# Patient Record
Sex: Male | Born: 1980 | Race: White | Hispanic: No | Marital: Married | Smoking: Current every day smoker
Health system: Southern US, Community
[De-identification: ages and names within clinical notes are randomized; demographics above are authoritative.]

## PROBLEM LIST (undated history)

## (undated) DIAGNOSIS — R569 Unspecified convulsions: Secondary | ICD-10-CM

## (undated) DIAGNOSIS — F419 Anxiety disorder, unspecified: Secondary | ICD-10-CM

## (undated) HISTORY — PX: FRACTURE SURGERY: SHX138

---

## 2003-07-13 ENCOUNTER — Other Ambulatory Visit: Payer: Self-pay

## 2004-06-15 ENCOUNTER — Emergency Department: Payer: Self-pay | Admitting: Emergency Medicine

## 2004-06-18 ENCOUNTER — Emergency Department: Payer: Self-pay | Admitting: Unknown Physician Specialty

## 2004-06-27 ENCOUNTER — Emergency Department: Payer: Self-pay | Admitting: Emergency Medicine

## 2004-07-30 ENCOUNTER — Emergency Department: Payer: Self-pay | Admitting: Emergency Medicine

## 2004-08-20 ENCOUNTER — Emergency Department: Payer: Self-pay | Admitting: Emergency Medicine

## 2004-12-07 ENCOUNTER — Emergency Department: Payer: Self-pay | Admitting: Emergency Medicine

## 2005-04-02 ENCOUNTER — Emergency Department: Payer: Self-pay | Admitting: Emergency Medicine

## 2005-05-31 ENCOUNTER — Emergency Department: Payer: Self-pay | Admitting: Internal Medicine

## 2005-07-01 ENCOUNTER — Emergency Department: Payer: Self-pay | Admitting: Emergency Medicine

## 2005-07-03 ENCOUNTER — Emergency Department: Payer: Self-pay | Admitting: Emergency Medicine

## 2005-07-04 ENCOUNTER — Emergency Department: Payer: Self-pay | Admitting: Unknown Physician Specialty

## 2005-07-05 ENCOUNTER — Other Ambulatory Visit: Payer: Self-pay

## 2005-07-05 ENCOUNTER — Emergency Department: Payer: Self-pay | Admitting: Emergency Medicine

## 2005-07-06 ENCOUNTER — Inpatient Hospital Stay: Payer: Self-pay | Admitting: Unknown Physician Specialty

## 2005-07-07 ENCOUNTER — Other Ambulatory Visit: Payer: Self-pay

## 2005-10-23 ENCOUNTER — Emergency Department: Payer: Self-pay | Admitting: Emergency Medicine

## 2005-10-29 ENCOUNTER — Ambulatory Visit: Payer: Self-pay | Admitting: Hospitalist

## 2005-10-29 ENCOUNTER — Inpatient Hospital Stay (HOSPITAL_COMMUNITY): Admission: EM | Admit: 2005-10-29 | Discharge: 2005-11-01 | Payer: Self-pay | Admitting: Emergency Medicine

## 2005-12-20 ENCOUNTER — Emergency Department: Payer: Self-pay | Admitting: Emergency Medicine

## 2006-01-02 ENCOUNTER — Emergency Department: Payer: Self-pay | Admitting: Emergency Medicine

## 2008-04-17 ENCOUNTER — Emergency Department: Payer: Self-pay | Admitting: Internal Medicine

## 2008-12-05 ENCOUNTER — Emergency Department: Payer: Self-pay | Admitting: Emergency Medicine

## 2009-02-02 ENCOUNTER — Ambulatory Visit: Payer: Self-pay | Admitting: Internal Medicine

## 2009-02-03 ENCOUNTER — Ambulatory Visit: Payer: Self-pay | Admitting: Internal Medicine

## 2009-02-15 ENCOUNTER — Other Ambulatory Visit: Payer: Self-pay | Admitting: Specialist

## 2009-02-25 ENCOUNTER — Ambulatory Visit: Payer: Self-pay | Admitting: Surgery

## 2010-02-27 ENCOUNTER — Emergency Department: Payer: Self-pay | Admitting: Emergency Medicine

## 2010-07-23 ENCOUNTER — Inpatient Hospital Stay: Payer: Self-pay | Admitting: Psychiatry

## 2010-08-23 ENCOUNTER — Ambulatory Visit: Payer: Self-pay | Admitting: Family Medicine

## 2010-08-28 ENCOUNTER — Ambulatory Visit: Payer: Self-pay | Admitting: Family Medicine

## 2011-01-17 ENCOUNTER — Ambulatory Visit: Payer: Self-pay | Admitting: Family Medicine

## 2013-01-31 IMAGING — CR DG ABDOMEN 1V
1 series · 2 of 2 positions shown · non-contrast
Comparison: none

REASON FOR EXAM: right flank pain
COMMENTS:

PROCEDURE:     MDR - MDR KIDNEY URETER BLADDER  - January 17, 2011 [DATE]
RESULT:     Comparison is made to a prior exam of 08/23/2010. The bowel gas
pattern is normal. No definite renal or ureteral calcifications are seen.
The osseous structures are normal in appearance.

[Series 1: view not recorded · 0.17mm/px · 2 of 2 slices shown]
[im 1/2]
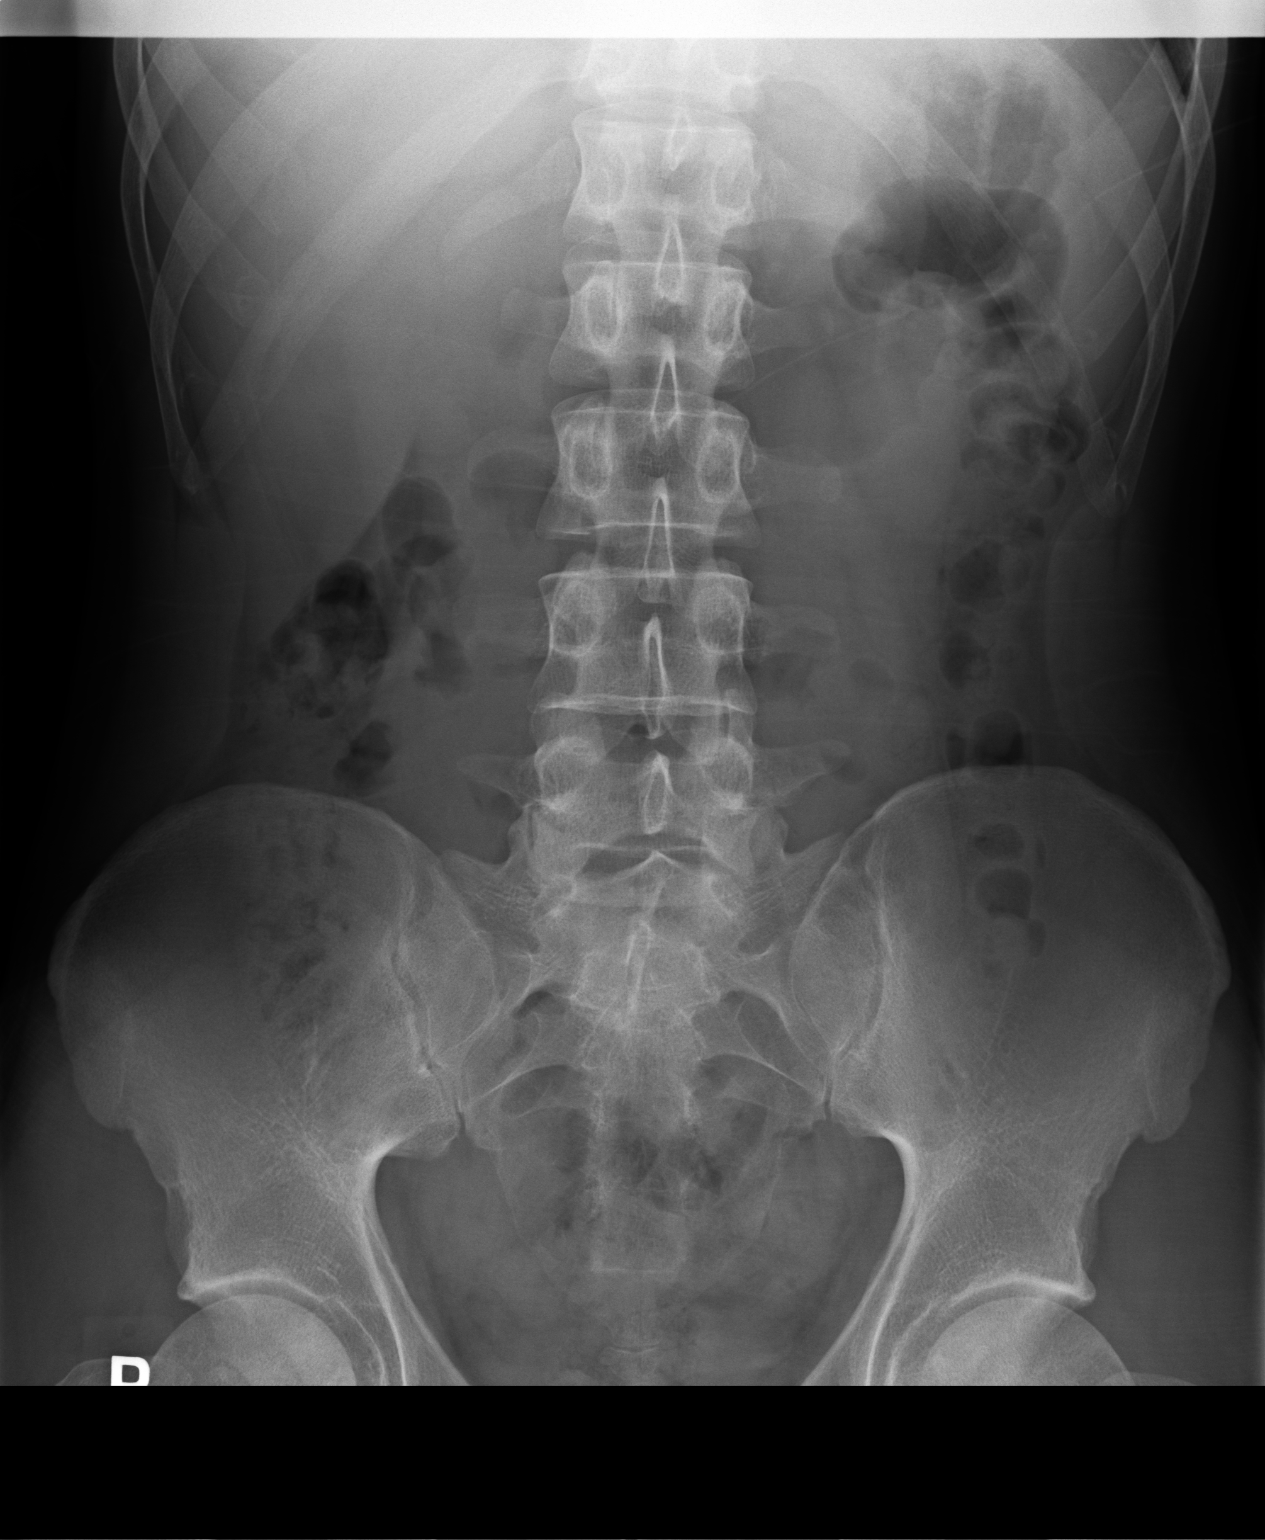
[im 2/2]
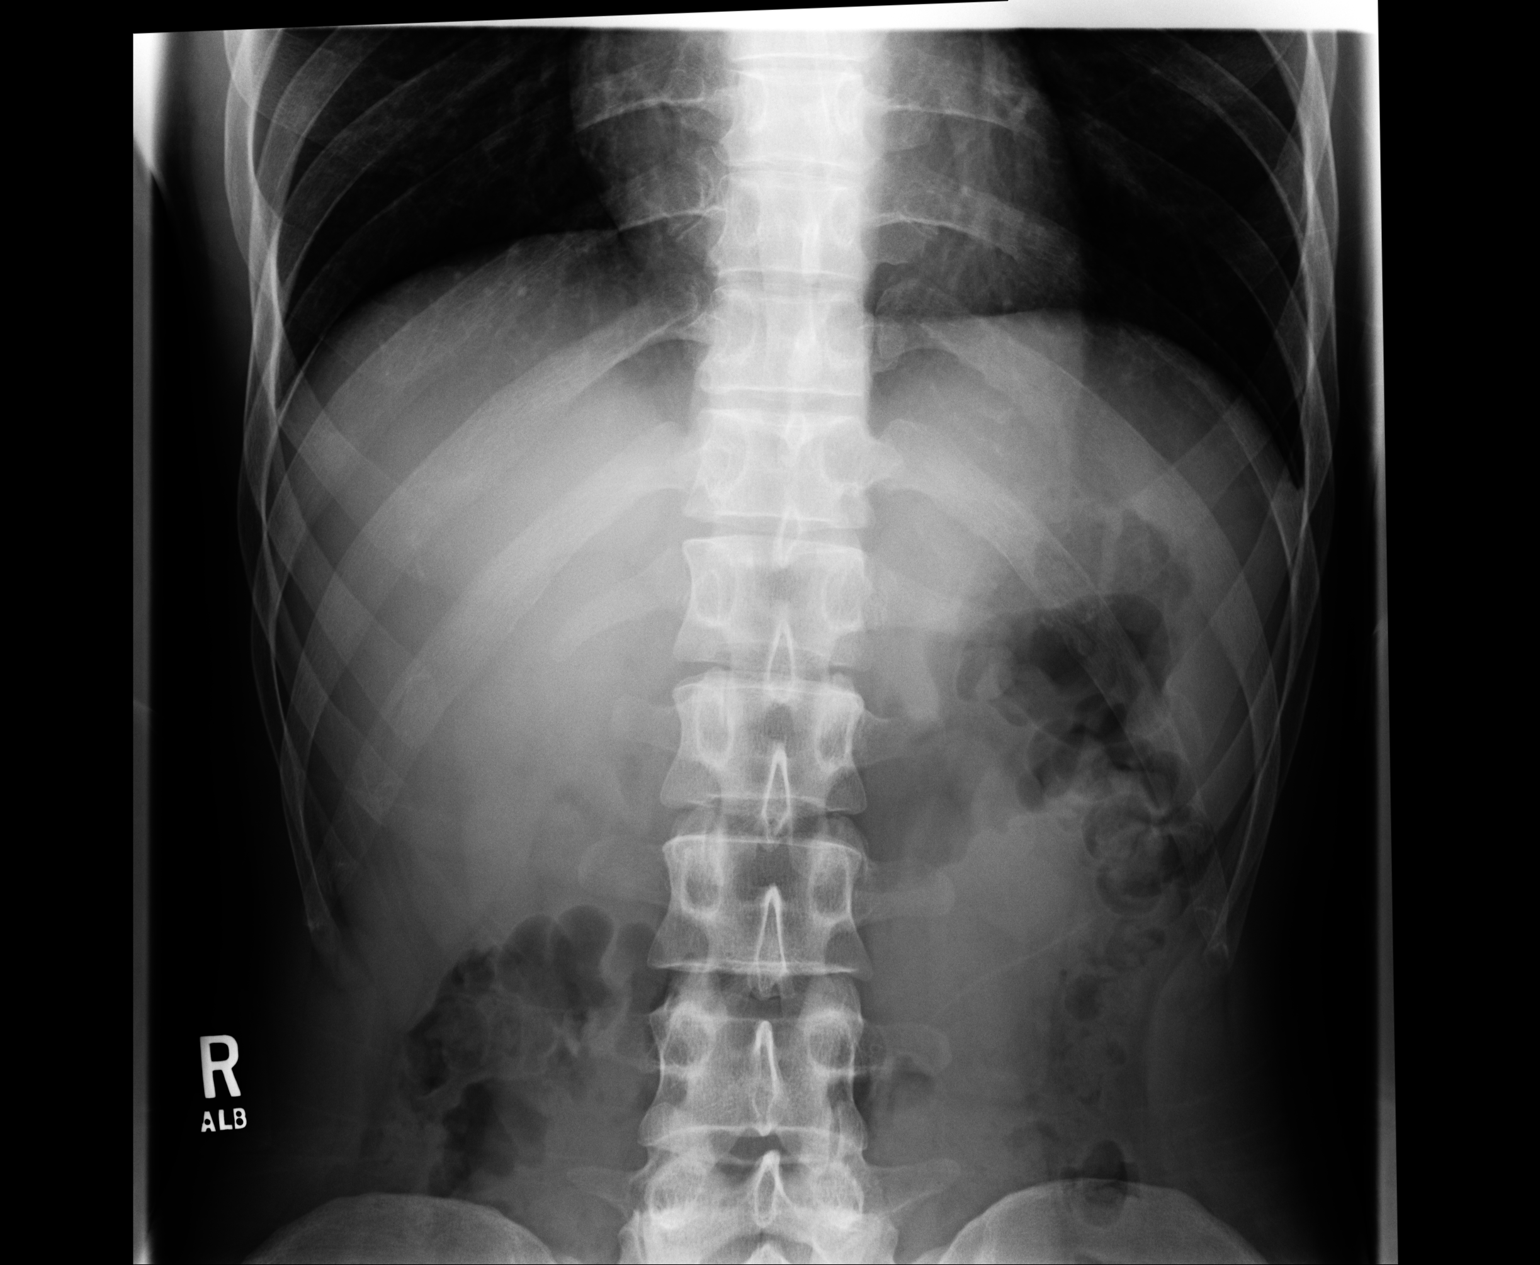

[2 of 2 positions shown; findings below may reference images not displayed]

IMPRESSION: 1.     No significant abnormalities are noted.

## 2015-04-23 ENCOUNTER — Emergency Department
Admission: EM | Admit: 2015-04-23 | Discharge: 2015-04-23 | Disposition: A | Discharge status: Home / Self Care | Payer: Medicaid Other | Attending: Emergency Medicine | Admitting: Emergency Medicine

## 2015-04-23 ENCOUNTER — Encounter: Payer: Self-pay | Admitting: Emergency Medicine

## 2015-04-23 DIAGNOSIS — R45851 Suicidal ideations: Secondary | ICD-10-CM

## 2015-04-23 DIAGNOSIS — F918 Other conduct disorders: Secondary | ICD-10-CM | POA: Insufficient documentation

## 2015-04-23 DIAGNOSIS — F17200 Nicotine dependence, unspecified, uncomplicated: Secondary | ICD-10-CM | POA: Insufficient documentation

## 2015-04-23 DIAGNOSIS — R4689 Other symptoms and signs involving appearance and behavior: Secondary | ICD-10-CM

## 2015-04-23 HISTORY — DX: Anxiety disorder, unspecified: F41.9

## 2015-04-23 LAB — CBC WITH DIFFERENTIAL/PLATELET
BASOS ABS: 0.1 10*3/uL (ref 0–0.1)
Basophils Relative: 1 %
EOS ABS: 0.2 10*3/uL (ref 0–0.7)
Eosinophils Relative: 2 %
HCT: 48 % (ref 40.0–52.0)
HEMOGLOBIN: 16.1 g/dL (ref 13.0–18.0)
LYMPHS ABS: 3.6 10*3/uL (ref 1.0–3.6)
LYMPHS PCT: 36 %
MCH: 29.3 pg (ref 26.0–34.0)
MCHC: 33.5 g/dL (ref 32.0–36.0)
MCV: 87.5 fL (ref 80.0–100.0)
Monocytes Absolute: 0.8 10*3/uL (ref 0.2–1.0)
Monocytes Relative: 8 %
NEUTROS PCT: 53 %
Neutro Abs: 5.3 10*3/uL (ref 1.4–6.5)
PLATELETS: 337 10*3/uL (ref 150–440)
RBC: 5.49 MIL/uL (ref 4.40–5.90)
RDW: 12.8 % (ref 11.5–14.5)
WBC: 10.1 10*3/uL (ref 3.8–10.6)

## 2015-04-23 LAB — URINALYSIS COMPLETE WITH MICROSCOPIC (ARMC ONLY)
BACTERIA UA: NONE SEEN
Glucose, UA: NEGATIVE mg/dL
Hgb urine dipstick: NEGATIVE
Leukocytes, UA: NEGATIVE
Nitrite: NEGATIVE
PH: 5 (ref 5.0–8.0)
PROTEIN: 100 mg/dL — AB
SQUAMOUS EPITHELIAL / LPF: NONE SEEN
Specific Gravity, Urine: 1.033 — ABNORMAL HIGH (ref 1.005–1.030)

## 2015-04-23 LAB — COMPREHENSIVE METABOLIC PANEL
ALBUMIN: 5 g/dL (ref 3.5–5.0)
ALK PHOS: 70 U/L (ref 38–126)
ALT: 15 U/L — AB (ref 17–63)
AST: 21 U/L (ref 15–41)
Anion gap: 7 (ref 5–15)
BUN: 15 mg/dL (ref 6–20)
CALCIUM: 9.8 mg/dL (ref 8.9–10.3)
CHLORIDE: 102 mmol/L (ref 101–111)
CO2: 32 mmol/L (ref 22–32)
CREATININE: 1.39 mg/dL — AB (ref 0.61–1.24)
GFR calc non Af Amer: >60 mL/min (ref >60)
GLUCOSE: 97 mg/dL (ref 65–99)
Potassium: 3.3 mmol/L — ABNORMAL LOW (ref 3.5–5.1)
SODIUM: 141 mmol/L (ref 135–145)
Total Bilirubin: 1.4 mg/dL — ABNORMAL HIGH (ref 0.3–1.2)
Total Protein: 8.1 g/dL (ref 6.5–8.1)

## 2015-04-23 LAB — URINE DRUG SCREEN, QUALITATIVE (ARMC ONLY)
AMPHETAMINES, UR SCREEN: NOT DETECTED
BENZODIAZEPINE, UR SCRN: POSITIVE — AB
Barbiturates, Ur Screen: NOT DETECTED
Cannabinoid 50 Ng, Ur ~~LOC~~: NOT DETECTED
Cocaine Metabolite,Ur ~~LOC~~: POSITIVE — AB
MDMA (ECSTASY) UR SCREEN: NOT DETECTED
Methadone Scn, Ur: NOT DETECTED
Opiate, Ur Screen: NOT DETECTED
PHENCYCLIDINE (PCP) UR S: NOT DETECTED
TRICYCLIC, UR SCREEN: NOT DETECTED

## 2015-04-23 LAB — SALICYLATE LEVEL: Salicylate Lvl: <4 mg/dL (ref 2.8–30.0)

## 2015-04-23 LAB — ACETAMINOPHEN LEVEL

## 2015-04-23 LAB — ETHANOL

## 2015-04-23 MED ORDER — BUPRENORPHINE HCL 8 MG SL SUBL
SUBLINGUAL_TABLET | SUBLINGUAL | Status: AC
  Filled 2015-04-23: qty 1

## 2015-04-23 MED ORDER — BUPRENORPHINE HCL 8 MG SL SUBL
8.0000 mg | SUBLINGUAL_TABLET | Freq: Once | SUBLINGUAL | Status: AC
  Administered 2015-04-23: 8 mg via SUBLINGUAL
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
## 2015-04-23 NOTE — ED Notes (Signed)
Zubolsv counted and sent to pharmacy
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
## 2015-04-23 NOTE — ED Provider Notes (Signed)
[facility] Emergency Department Provider Note  ____________________________________________  Time seen: Approximately 12:25 PM  I have reviewed the triage vital signs and the nursing notes.   HISTORY  Chief Complaint Aggressive Behavior    HPI Scott Hanson is a 34 y.o. male with a history of anxiety who is presenting today with aggressive behavior at home. Per the involuntary commitment paperwork the patient Loc some of his family members in a closet. He then threatened to kill himself as well as others. At this point in the emergency department he is calm and says that he has had suicidal thoughts but no specific plan. He says that his anxiety and difficulties at home are stemming from disagreements with his wife over the children. Denies any hallucinations.   Past Medical History  Diagnosis Date  . Anxiety     There are no active problems to display for this patient.   History reviewed. No pertinent past surgical history.  No current outpatient prescriptions on file.  Allergies Review of patient's allergies indicates no known allergies.  History reviewed. No pertinent family history.  Social History Social History  Substance Use Topics  . Smoking status: Current Every Day Smoker  . Smokeless tobacco: None  . Alcohol Use: Yes    Review of Systems Constitutional: No fever/chills Eyes: No visual changes. ENT: No sore throat. Cardiovascular: Denies chest pain. Respiratory: Denies shortness of breath. Gastrointestinal: No abdominal pain.  No nausea, no vomiting.  No diarrhea.  No constipation. Genitourinary: Negative for dysuria. Musculoskeletal: Negative for back pain. Skin: Negative for rash. Neurological: Negative for headaches, focal weakness or numbness.  10-point ROS otherwise negative.  ____________________________________________   PHYSICAL EXAM:  VITAL SIGNS: ED Triage Vitals  Enc Vitals Group     BP 04/23/15  1012 136/85 mmHg     Pulse Rate 04/23/15 1012 99     Resp 04/23/15 1012 20     Temp 04/23/15 1012 98.4 F (36.9 C)     Temp Source 04/23/15 1012 Oral     SpO2 04/23/15 1012 97 %     Weight 04/23/15 1012 145 lb (65.772 kg)     Height 04/23/15 1012 5\' 9"  (1.753 m)     Head Cir --      Peak Flow --      Pain Score 04/23/15 1006 0     Pain Loc --      Pain Edu? --      Excl. in GC? --     Constitutional: Alert and oriented. Well appearing and in no acute distress. Eyes: Conjunctivae are normal. PERRL. EOMI. Head: Atraumatic. Nose: No congestion/rhinnorhea. Mouth/Throat: Mucous membranes are moist.  Oropharynx non-erythematous. Neck: No stridor.   Cardiovascular: Normal rate, regular rhythm. Grossly normal heart sounds.  Good peripheral circulation. Respiratory: Normal respiratory effort.  No retractions. Lungs CTAB. Gastrointestinal: Soft and nontender. No distention. No abdominal bruits. No CVA tenderness. Musculoskeletal: No lower extremity tenderness nor edema.  No joint effusions. Neurologic:  Normal speech and language. No gross focal neurologic deficits are appreciated. No gait instability. Skin:  Skin is warm, dry and intact. No rash noted. Psychiatric: Mood and affect are normal. Speech and behavior are normal.  ____________________________________________   LABS (all labs ordered are listed, but only abnormal results are displayed)  Labs Reviewed  COMPREHENSIVE METABOLIC PANEL - Abnormal; Notable for the following:    Potassium 3.3 (*)    Creatinine, Ser 1.39 (*)    ALT 15 (*)  Total Bilirubin 1.4 (*)    All other components within normal limits  URINALYSIS COMPLETEWITH MICROSCOPIC ([facility] ONLY) - Abnormal; Notable for the following:    Color, Urine AMBER (*)    APPearance CLOUDY (*)    Bilirubin Urine 1+ (*)    Ketones, ur TRACE (*)    Specific Gravity, Urine 1.033 (*)    Protein, ur 100 (*)    All other components within normal limits  URINE DRUG SCREEN,  QUALITATIVE ([facility] ONLY) - Abnormal; Notable for the following:    Cocaine Metabolite,Ur Stow POSITIVE (*)    Benzodiazepine, Ur Scrn POSITIVE (*)    All other components within normal limits  ACETAMINOPHEN LEVEL - Abnormal; Notable for the following:    Acetaminophen (Tylenol), Serum <10 (*)    All other components within normal limits  CBC WITH DIFFERENTIAL/PLATELET  ETHANOL  SALICYLATE LEVEL   ____________________________________________  EKG   ____________________________________________  RADIOLOGY   ____________________________________________   PROCEDURES    ____________________________________________   INITIAL IMPRESSION / ASSESSMENT AND PLAN / ED COURSE  Pertinent labs & imaging results that were available during my care of the patient were reviewed by me and considered in my medical decision making (see chart for details).  ----------------------------------------- 4:26 PM on 04/23/2015 -----------------------------------------  Patient evaluated by psychiatry and discharged into police custody. IVC rescinded. ____________________________________________   FINAL CLINICAL IMPRESSION(S) / ED DIAGNOSES  Suicidal ideation. Aggressive behavior.    Myrna Blazer, MD 04/23/15 [phone]

## 2015-04-23 NOTE — ED Provider Notes (Signed)
-----------------------------------------   3:19 PM on 04/23/2015 -----------------------------------------  Patient has been seen and evaluated by psychiatry. They believe the patient psychiatrically stable for discharge. [place] [facility] [place] as the patient is possibly under their custody at this time. Labs within normal limits besides urine tox positive for cocaine and benzodiazepines.  Minna AntisKevin Dion Parrow, MD 04/23/15 [phone]
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
## 2015-04-23 NOTE — ED Notes (Signed)
Pt denies SI during assessment, pt states he told police if he went to jail he would kill himself and his wife. Pt reports drug use of crack, cocaine and marjuana

## 2015-04-23 NOTE — Discharge Instructions (Signed)
Please follow-up with your primary care provider for further treatment and evaluation. Please return to the emergency department your having any thoughts of hurting yourself or anyone else so that we may attempt to help you.    uicidal Feelings: How to Help Yourself Suicide is the taking of one's own life. If you feel as though life is getting too tough to handle and are thinking about suicide, get help right away. To get help:  Call your local emergency services (911 in the [place]  Call a suicide hotline to speak with a trained counselor who understands how you are feeling. The following is a list of suicide hotlines in the [place]. For a list of hotlines in [place], visit FindSkins.pl.  1-800-273-TALK (903)023-7683).  1-800-SUICIDE 7084616082).  (336) 321-6028. This is a hotline for Spanish speakers.  8-502-774-1OIN 571-048-0976). This is a hotline for TTY users.  1-866-4-U-TREVOR 2245271540). This is a hotline for lesbian, gay, bisexual, transgender, or questioning youth.  Contact a crisis center or a local suicide prevention center. To find a crisis center or suicide prevention center:  Call your local hospital, clinic, community service organization, mental health center, social service provider, or health department. Ask for assistance in connecting to a crisis center.  Visit BankingRep.com.au for a list of crisis centers in the [place], or visit www.suicideprevention.ca/thinking-about-suicide/find-a-crisis-centre for a list of centers in [place].  Visit the following websites:  National Suicide Prevention Lifeline: www.suicidepreventionlifeline.org  Hopeline: www.hopeline.[place] for Suicide Prevention: PromotionalLoans.co.za  The [employer] (for lesbian, gay, bisexual, transgender, or questioning youth): www.thetrevorproject.org HOW CAN I  HELP MYSELF FEEL BETTER?  Promise yourself that you will not do anything drastic when you have suicidal feelings. Remember, there is hope. Many people have gotten through suicidal thoughts and feelings, and you will, too. You may have gotten through them before, and this proves that you can get through them again.  Let family, friends, teachers, or counselors know how you are feeling. Try not to isolate yourself from those who care about you. Remember, they will want to help you. Talk with someone every day, even if you do not feel sociable. Face-to-face conversation is best.  Call a mental health professional and see one regularly.  Visit your primary health care provider every year.  Eat a well-balanced diet, and space your meals so you eat regularly.  Get plenty of rest.  Avoid alcohol and drugs, and remove them from your home. They will only make you feel worse.  If you are thinking of taking a lot of medicine, give your medicine to someone who can give it to you one day at a time. If you are on antidepressants and are concerned you will overdose, let your health care provider know so he or she can give you safer medicines. Ask your mental health professional about the possible side effects of any medicines you are taking.  Remove weapons, poisons, knives, and anything else that could harm you from your home.  Try to stick to routines. Follow a schedule every day. Put self-care on your schedule.  Make a list of realistic goals, and cross them off when you achieve them. Accomplishments give a sense of worth.  Wait until you are feeling better before doing the things you find difficult or unpleasant.  Exercise if you are able. You will feel better if you exercise for even a half hour each day.  Go out in the sun or into nature. This will help you recover from depression  faster. If you have a favorite place to walk, go there.  Do the things that have always given you pleasure. Play  your favorite music, read a good book, paint a picture, play your favorite instrument, or do anything else that takes your mind off your depression if it is safe to do.  Keep your living space well lit.  When you are feeling well, write yourself a letter about tips and support that you can read when you are not feeling well.  Remember that life's difficulties can be sorted out with help. Conditions can be treated. You can work on thoughts and strategies that serve you well.   This information is not intended to replace advice given to you by your health care provider. Make sure you discuss any questions you have with your health care provider.   Document Released: 11/25/2002 Document Revised: 06/11/2014 Document Reviewed: 09/15/2013 Elsevier Interactive Patient Education 2016 [place] and Stress Management Stress is a normal reaction to life events. It is what you feel when life demands more than you are used to or more than you can handle. Some stress can be useful. For example, the stress reaction can help you catch the last bus of the day, study for a test, or meet a deadline at work. But stress that occurs too often or for too long can cause problems. It can affect your emotional health and interfere with relationships and normal daily activities. Too much stress can weaken your immune system and increase your risk for physical illness. If you already have a medical problem, stress can make it worse. CAUSES  All sorts of life events may cause stress. An event that causes stress for one person may not be stressful for another person. Major life events commonly cause stress. These may be positive or negative. Examples include losing your job, moving into a new home, getting married, having a baby, or losing a loved one. Less obvious life events may also cause stress, especially if they occur day after day or in combination. Examples include working long hours, driving in traffic,  caring for children, being in debt, or being in a difficult relationship. SIGNS AND SYMPTOMS Stress may cause emotional symptoms including, the following:  Anxiety. This is feeling worried, afraid, on edge, overwhelmed, or out of control.  Anger. This is feeling irritated or impatient.  Depression. This is feeling sad, down, helpless, or guilty.  Difficulty focusing, remembering, or making decisions. Stress may cause physical symptoms, including the following:   Aches and pains. These may affect your head, neck, back, stomach, or other areas of your body.  Tight muscles or clenched jaw.  Low energy or trouble sleeping. Stress may cause unhealthy behaviors, including the following:   Eating to feel better (overeating) or skipping meals.  Sleeping too little, too much, or both.  Working too much or putting off tasks (procrastination).  Smoking, drinking alcohol, or using drugs to feel better. DIAGNOSIS  Stress is diagnosed through an assessment by your health care provider. Your health care provider will ask questions about your symptoms and any stressful life events.Your health care provider will also ask about your medical history and may order blood tests or other tests. Certain medical conditions and medicine can cause physical symptoms similar to stress. Mental illness can cause emotional symptoms and unhealthy behaviors similar to stress. Your health care provider may refer you to a mental health professional for further evaluation.  TREATMENT  Stress management is the recommended  treatment for stress.The goals of stress management are reducing stressful life events and coping with stress in healthy ways.  Techniques for reducing stressful life events include the following:  Stress identification. Self-monitor for stress and identify what causes stress for you. These skills may help you to avoid some stressful events.  Time management. Set your priorities, keep a calendar  of events, and learn to say "no." These tools can help you avoid making too many commitments. Techniques for coping with stress include the following:  Rethinking the problem. Try to think realistically about stressful events rather than ignoring them or overreacting. Try to find the positives in a stressful situation rather than focusing on the negatives.  Exercise. Physical exercise can release both physical and emotional tension. The key is to find a form of exercise you enjoy and do it regularly.  Relaxation techniques. These relax the body and mind. Examples include yoga, meditation, tai chi, biofeedback, deep breathing, progressive muscle relaxation, listening to music, being out in nature, journaling, and other hobbies. Again, the key is to find one or more that you enjoy and can do regularly.  Healthy lifestyle. Eat a balanced diet, get plenty of sleep, and do not smoke. Avoid using alcohol or drugs to relax.  Strong support network. Spend time with family, friends, or other people you enjoy being around.Express your feelings and talk things over with someone you trust. Counseling or talktherapy with a mental health professional may be helpful if you are having difficulty managing stress on your own. Medicine is typically not recommended for the treatment of stress.Talk to your health care provider if you think you need medicine for symptoms of stress. HOME CARE INSTRUCTIONS  Keep all follow-up visits as directed by your health care provider.  Take all medicines as directed by your health care provider. SEEK MEDICAL CARE IF:  Your symptoms get worse or you start having new symptoms.  You feel overwhelmed by your problems and can no longer manage them on your own. SEEK IMMEDIATE MEDICAL CARE IF:  You feel like hurting yourself or someone else.   This information is not intended to replace advice given to you by your health care provider. Make sure you discuss any questions you  have with your health care provider.   Document Released: 11/14/2000 Document Revised: 06/11/2014 Document Reviewed: 01/13/2013 Elsevier Interactive Patient Education [employer].

## 2015-05-03 ENCOUNTER — Encounter: Payer: Self-pay | Admitting: Emergency Medicine

## 2015-05-03 ENCOUNTER — Emergency Department
Admission: EM | Admit: 2015-05-03 | Discharge: 2015-05-04 | Payer: Medicaid Other | Attending: Emergency Medicine | Admitting: Emergency Medicine

## 2015-05-03 DIAGNOSIS — M549 Dorsalgia, unspecified: Secondary | ICD-10-CM | POA: Insufficient documentation

## 2015-05-03 DIAGNOSIS — M542 Cervicalgia: Secondary | ICD-10-CM | POA: Insufficient documentation

## 2015-05-03 DIAGNOSIS — R4689 Other symptoms and signs involving appearance and behavior: Secondary | ICD-10-CM

## 2015-05-03 DIAGNOSIS — F911 Conduct disorder, childhood-onset type: Secondary | ICD-10-CM | POA: Insufficient documentation

## 2015-05-03 DIAGNOSIS — Z046 Encounter for general psychiatric examination, requested by authority: Secondary | ICD-10-CM | POA: Insufficient documentation

## 2015-05-03 DIAGNOSIS — R112 Nausea with vomiting, unspecified: Secondary | ICD-10-CM | POA: Insufficient documentation

## 2015-05-03 DIAGNOSIS — R451 Restlessness and agitation: Secondary | ICD-10-CM | POA: Insufficient documentation

## 2015-05-03 DIAGNOSIS — R109 Unspecified abdominal pain: Secondary | ICD-10-CM | POA: Insufficient documentation

## 2015-05-03 DIAGNOSIS — F17200 Nicotine dependence, unspecified, uncomplicated: Secondary | ICD-10-CM | POA: Insufficient documentation

## 2015-05-03 DIAGNOSIS — Z008 Encounter for other general examination: Secondary | ICD-10-CM

## 2015-05-03 HISTORY — DX: Unspecified convulsions: R56.9

## 2015-05-03 LAB — COMPREHENSIVE METABOLIC PANEL
ALBUMIN: 5.1 g/dL — AB (ref 3.5–5.0)
ALK PHOS: 63 U/L (ref 38–126)
ALT: 16 U/L — ABNORMAL LOW (ref 17–63)
AST: 25 U/L (ref 15–41)
Anion gap: 8 (ref 5–15)
BILIRUBIN TOTAL: 0.7 mg/dL (ref 0.3–1.2)
BUN: 25 mg/dL — AB (ref 6–20)
CALCIUM: 9.4 mg/dL (ref 8.9–10.3)
CO2: 30 mmol/L (ref 22–32)
Chloride: 101 mmol/L (ref 101–111)
Creatinine, Ser: 1.47 mg/dL — ABNORMAL HIGH (ref 0.61–1.24)
GFR calc Af Amer: >60 mL/min (ref >60)
GFR, EST NON AFRICAN AMERICAN: 60 mL/min — AB (ref >60)
GLUCOSE: 99 mg/dL (ref 65–99)
Potassium: 4.5 mmol/L (ref 3.5–5.1)
Sodium: 139 mmol/L (ref 135–145)
TOTAL PROTEIN: 7.8 g/dL (ref 6.5–8.1)

## 2015-05-03 LAB — CBC
HCT: 46.6 % (ref 40.0–52.0)
Hemoglobin: 15.1 g/dL (ref 13.0–18.0)
MCH: 28.5 pg (ref 26.0–34.0)
MCHC: 32.5 g/dL (ref 32.0–36.0)
MCV: 87.9 fL (ref 80.0–100.0)
PLATELETS: 322 10*3/uL (ref 150–440)
RBC: 5.3 MIL/uL (ref 4.40–5.90)
RDW: 12.9 % (ref 11.5–14.5)
WBC: 14.6 10*3/uL — ABNORMAL HIGH (ref 3.8–10.6)

## 2015-05-03 LAB — ETHANOL

## 2015-05-03 NOTE — ED Notes (Addendum)
Here for clearance for jail.  Stated he took "a bunch of pills" today at 1500.  Unknown substance.  States last took suboxone yesterday.

## 2015-05-04 NOTE — ED Notes (Signed)
Pt denies need to void at present; pt given water to drink to facilitate, officers uncuffed pt's hands and secured cuff to rail; pt requesting sandwich tray, warm blanket and sprite; pt informed that a urine sample is needed and once obtained that these will be given as requested per MD; pt now yelling, threw cup water on bed and demanding to see the physician; pt informed that the doctor is in another room and that he will need to give urine sample or a catheter will be placed to obtain it; pt continues to curse, yell and attempting to throw self in floor; officers at bedside to re-cuff pt's hands; pt continues to curse and yell, aggressive; MD aware

## 2015-05-04 NOTE — ED Notes (Signed)
Pt declared medically cleared by MD and will d/c to jail; pt refuses to sign d/c form, continues to yell, curse, aggressive; [place] deputies given d/c instructions to take with them and voice understanding of such
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
## 2015-05-04 NOTE — ED Notes (Addendum)
Dr Zenda AlpersWebster in to see pt; pt calm, cooperative; A&Ox3; [place] deputies remain with pt, pt is handcuffed as he is in their custody

## 2015-05-04 NOTE — Discharge Instructions (Signed)
Medical Screening Exam °A medical screening exam has been done. This exam helps find the cause of your problem and determines whether you need emergency treatment. Your exam has shown that you do not need emergency treatment at this point. It is safe for you to go to your caregiver's office or clinic for treatment. You should make an appointment today to see your caregiver as soon as he or she is available. °Depending on your illness, your symptoms and condition can change over time. If your condition gets worse or you develop new or troubling symptoms before you see your caregiver, you should return to the emergency department for further evaluation.  °  °This information is not intended to replace advice given to you by your health care provider. Make sure you discuss any questions you have with your health care provider. °  °Document Released: 06/28/2004 Document Revised: 06/11/2014 Document Reviewed: 02/07/2011 °Elsevier Interactive Patient Education ©2016 Elsevier Inc. ° °

## 2019-10-26 ENCOUNTER — Encounter: Payer: Self-pay | Admitting: Emergency Medicine

## 2019-10-26 ENCOUNTER — Ambulatory Visit
Admission: EM | Admit: 2019-10-26 | Discharge: 2019-10-26 | Disposition: A | Discharge status: Home / Self Care | Payer: Self-pay | Attending: Family Medicine | Admitting: Family Medicine

## 2019-10-26 ENCOUNTER — Other Ambulatory Visit: Payer: Self-pay

## 2019-10-26 DIAGNOSIS — G43009 Migraine without aura, not intractable, without status migrainosus: Secondary | ICD-10-CM

## 2019-10-26 MED ORDER — KETOROLAC TROMETHAMINE 10 MG PO TABS: 10.0000 mg | ORAL_TABLET | Freq: Three times a day (TID) | ORAL | 0 refills | Status: AC | PRN

## 2019-10-26 MED ORDER — KETOROLAC TROMETHAMINE 60 MG/2ML IM SOLN
60.0000 mg | Freq: Once | INTRAMUSCULAR | Status: AC
  Administered 2019-10-26: 60 mg via INTRAMUSCULAR

## 2019-10-26 MED ORDER — ONDANSETRON 8 MG PO TBDP: 8.0000 mg | ORAL_TABLET | Freq: Three times a day (TID) | ORAL | 0 refills | Status: AC | PRN

## 2019-10-26 MED ORDER — PROMETHAZINE HCL 25 MG/ML IJ SOLN
25.0000 mg | Freq: Once | INTRAMUSCULAR | Status: AC
  Administered 2019-10-26: 25 mg via INTRAMUSCULAR

## 2019-10-26 NOTE — Discharge Instructions (Signed)
Follow up with Primary Care Provider °

## 2019-10-26 NOTE — ED Provider Notes (Signed)
[facility]    CSN: [id number] Arrival date & time: 10/26/19  [name]      History   Chief Complaint Chief Complaint  Patient presents with  . Migraine    HPI Scott Hanson is a 39 y.o. male.   39 yo male with a c/o migraine headache for the past 3 days unrelieved with usual medications of tylenol and ibuprofen. Patient states symptoms are similar to prior migraines, right sided pain associated with nausea, vomiting and photophobia. Denies any fevers, chills, numbness/tingling, unilateral weakness.    Migraine    Past Medical History:  Diagnosis Date  . Anxiety   . Seizures ([place])     There are no problems to display for this patient.   Past Surgical History:  Procedure Laterality Date  . FRACTURE SURGERY Right    humerus       Home Medications    Prior to Admission medications   Medication Sig Start Date End Date Taking? Authorizing Provider  ketorolac (TORADOL) 10 MG tablet Take 1 tablet (10 mg total) by mouth every 8 (eight) hours as needed. 10/26/19   Norval Gable, MD  ondansetron (ZOFRAN ODT) 8 MG disintegrating tablet Take 1 tablet (8 mg total) by mouth every 8 (eight) hours as needed. 10/26/19   Norval Gable, MD    Family History Family History  Problem Relation Age of Onset  . Healthy Mother   . Healthy Father     Social History Social History   Tobacco Use  . Smoking status: Current Every Day Smoker    Types: Cigarettes  . Smokeless tobacco: Never Used  Substance Use Topics  . Alcohol use: Yes  . Drug use: Not Currently    Types: Cocaine    Comment: prior usage byt pt denies it now.      Allergies   Patient has no known allergies.   Review of Systems Review of Systems   Physical Exam Triage Vital Signs ED Triage Vitals  Enc Vitals Group     BP 10/26/19 1838 (!) 147/103     Pulse Rate 10/26/19 1838 64     Resp 10/26/19 1838 18     Temp 10/26/19 1838 98.6 F (37 C)     Temp Source 10/26/19 1838 Oral       SpO2 10/26/19 1838 100 %     Weight 10/26/19 1833 149 lb 14.6 oz (68 kg)     Height 10/26/19 1833 5\' 9"  (1.753 m)     Head Circumference --      Peak Flow --      Pain Score 10/26/19 1833 10     Pain Loc --      Pain Edu? --      Excl. in [place]? --    No data found.  Updated Vital Signs BP (!) 147/103 (BP Location: Left Arm)   Pulse 64   Temp 98.6 F (37 C) (Oral)   Resp 18   Ht 5\' 9"  (1.753 m)   Wt 68 kg   SpO2 100%   BMI 22.14 kg/m   Visual Acuity Right Eye Distance:   Left Eye Distance:   Bilateral Distance:    Right Eye Near:   Left Eye Near:    Bilateral Near:     Physical Exam Vitals and nursing note reviewed.  Constitutional:      General: He is not in acute distress.    Appearance: He is not toxic-appearing or diaphoretic.  HENT:  Right Ear: Tympanic membrane normal.     Left Ear: Tympanic membrane normal.  Eyes:     Extraocular Movements: Extraocular movements intact.     Pupils: Pupils are equal, round, and reactive to light.  Cardiovascular:     Rate and Rhythm: Normal rate.  Pulmonary:     Effort: Pulmonary effort is normal. No respiratory distress.  Musculoskeletal:     Cervical back: Neck supple.  Neurological:     General: No focal deficit present.     Mental Status: He is alert and oriented to person, place, and time.     Cranial Nerves: No cranial nerve deficit.     Sensory: No sensory deficit.     Motor: No weakness.     Coordination: Coordination normal.     Gait: Gait normal.     Deep Tendon Reflexes: Reflexes normal.      UC Treatments / Results  Labs (all labs ordered are listed, but only abnormal results are displayed) Labs Reviewed - No data to display  EKG   Radiology No results found.  Procedures Procedures (including critical care time)  Medications Ordered in UC Medications  promethazine (PHENERGAN) injection 25 mg (25 mg Intramuscular Given 10/26/19 1912)  ketorolac (TORADOL) injection 60 mg (60 mg  Intramuscular Given 10/26/19 1913)    Initial Impression / Assessment and Plan / UC Course  I have reviewed the triage vital signs and the nursing notes.  Pertinent labs & imaging results that were available during my care of the patient were reviewed by me and considered in my medical decision making (see chart for details).      Final Clinical Impressions(s) / UC Diagnoses   Final diagnoses:  Migraine without aura and without status migrainosus, not intractable     Discharge Instructions     Follow up with Primary Care Provider    ED Prescriptions    Medication Sig Dispense Auth. Provider   ketorolac (TORADOL) 10 MG tablet Take 1 tablet (10 mg total) by mouth every 8 (eight) hours as needed. 10 tablet Payton Mccallum, MD   ondansetron (ZOFRAN ODT) 8 MG disintegrating tablet Take 1 tablet (8 mg total) by mouth every 8 (eight) hours as needed. 6 tablet Payton Mccallum, MD      1. diagnosis reviewed with patient 2. Patient given toradol 60mg  IM x 1 and phenergan 25mg  IM x 1 with improvement of migraine  3. rx as per orders above; reviewed possible side effects, interactions, risks and benefits  4. Follow up with PCP 5. Follow-up prn   PDMP not reviewed this encounter.   , MD 10/26/19 2014

## 2019-10-26 NOTE — ED Triage Notes (Addendum)
Pt c/o migraine. Started about 3 days ago. He has tried tylenol ibuprofen without relief. He states he has had migraines before but not this bad. Pt states he has blurred vision in his right eye. He is holding an ice pack on his and rocking back and forth during triage. Denies trauma to his head.

## 2021-07-09 ENCOUNTER — Other Ambulatory Visit: Payer: Self-pay

## 2021-07-09 ENCOUNTER — Ambulatory Visit
Admission: EM | Admit: 2021-07-09 | Discharge: 2021-07-09 | Disposition: A | Discharge status: Home / Self Care | Payer: PRIVATE HEALTH INSURANCE | Attending: Emergency Medicine | Admitting: Emergency Medicine

## 2021-07-09 DIAGNOSIS — B9689 Other specified bacterial agents as the cause of diseases classified elsewhere: Secondary | ICD-10-CM | POA: Diagnosis not present

## 2021-07-09 DIAGNOSIS — J069 Acute upper respiratory infection, unspecified: Secondary | ICD-10-CM

## 2021-07-09 MED ORDER — AZITHROMYCIN 250 MG PO TABS: 250.0000 mg | ORAL_TABLET | Freq: Every day | ORAL | 0 refills | Status: AC

## 2021-07-09 MED ORDER — ALBUTEROL SULFATE HFA 108 (90 BASE) MCG/ACT IN AERS: 2.0000 | INHALATION_SPRAY | RESPIRATORY_TRACT | 0 refills | Status: AC | PRN

## 2021-07-09 NOTE — ED Triage Notes (Signed)
Pt c/o Headache, chest congestion, Fatigue, SOB, Diarrhea x1-2weeks

## 2021-07-09 NOTE — ED Provider Notes (Signed)
[facility]    CSN: [id number] Arrival date & time: 07/09/21  1459      History   Chief Complaint Chief Complaint  Patient presents with   Cough   Diarrhea   Shortness of Breath    HPI Scott Hanson is a 41 y.o. male.   Patient presents with fatigue, nasal congestion, rhinorrhea, sore throat, nonproductive cough, intermittent shortness of breath and wheezing and diarrhea for 2 weeks.  Endorses that symptoms worsened over the last 5 days.  Last episode of diarrhea 2 days ago.  Decreased appetite but tolerating fluids.  No known sick contacts in household.  Has not attempted treatment of symptoms. History of anxiety, seizures.   Past Medical History:  Diagnosis Date   Anxiety    Seizures ([facility])     There are no problems to display for this patient.   Past Surgical History:  Procedure Laterality Date   FRACTURE SURGERY Right    humerus       Home Medications    Prior to Admission medications   Medication Sig Start Date End Date Taking? Authorizing Provider  ketorolac (TORADOL) 10 MG tablet Take 1 tablet (10 mg total) by mouth every 8 (eight) hours as needed. 10/26/19   Payton Mccallum, MD  ondansetron (ZOFRAN ODT) 8 MG disintegrating tablet Take 1 tablet (8 mg total) by mouth every 8 (eight) hours as needed. 10/26/19   Payton Mccallum, MD    Family History Family History  Problem Relation Age of Onset   Healthy Mother    Healthy Father     Social History Social History   Tobacco Use   Smoking status: Every Day    Types: Cigarettes   Smokeless tobacco: Never  Vaping Use   Vaping Use: Never used  Substance Use Topics   Alcohol use: Not Currently   Drug use: Not Currently    Types: Cocaine    Comment: prior usage byt pt denies it now.      Allergies   Patient has no known allergies.   Review of Systems Review of Systems  Constitutional:  Positive for appetite change and fatigue. Negative for activity change, chills,  diaphoresis, fever and unexpected weight change.  HENT:  Positive for congestion, rhinorrhea and sore throat. Negative for dental problem, drooling, ear discharge, ear pain, facial swelling, hearing loss, mouth sores, nosebleeds, postnasal drip, sinus pressure, sinus pain, sneezing, tinnitus, trouble swallowing and voice change.   Respiratory:  Positive for cough, shortness of breath and wheezing. Negative for apnea, choking, chest tightness and stridor.   Cardiovascular: Negative.   Gastrointestinal:  Positive for diarrhea. Negative for abdominal distention, abdominal pain, anal bleeding, blood in stool, constipation, nausea, rectal pain and vomiting.  Skin: Negative.   Neurological: Negative.     Physical Exam Triage Vital Signs ED Triage Vitals  Enc Vitals Group     BP 07/09/21 1521 130/84     Pulse Rate 07/09/21 1521 83     Resp 07/09/21 1521 18     Temp 07/09/21 1521 98.7 F (37.1 C)     Temp Source 07/09/21 1521 Oral     SpO2 07/09/21 1521 99 %     Weight 07/09/21 1519 130 lb (59 kg)     Height 07/09/21 1519 5\' 11"  (1.803 m)     Head Circumference --      Peak Flow --      Pain Score 07/09/21 1518 7     Pain Loc --  Pain Edu? --      Excl. in [place]? --    No data found.  Updated Vital Signs BP 130/84 (BP Location: Left Arm)    Pulse 83    Temp 98.7 F (37.1 C) (Oral)    Resp 18    Ht 5\' 11"  (1.803 m)    Wt 130 lb (59 kg)    SpO2 99%    BMI 18.13 kg/m   Visual Acuity Right Eye Distance:   Left Eye Distance:   Bilateral Distance:    Right Eye Near:   Left Eye Near:    Bilateral Near:     Physical Exam Constitutional:      Appearance: Normal appearance.  HENT:     Right Ear: Tympanic membrane, ear canal and external ear normal.     Left Ear: Tympanic membrane, ear canal and external ear normal.     Nose: Congestion and rhinorrhea present.     Mouth/Throat:     Mouth: Mucous membranes are moist.     Pharynx: Posterior oropharyngeal erythema present.  Eyes:      Extraocular Movements: Extraocular movements intact.  Cardiovascular:     Rate and Rhythm: Normal rate and regular rhythm.     Pulses: Normal pulses.     Heart sounds: Normal heart sounds.  Pulmonary:     Effort: Pulmonary effort is normal.     Breath sounds: Normal breath sounds.  Musculoskeletal:     Cervical back: Normal range of motion.  Lymphadenopathy:     Cervical: Cervical adenopathy present.  Skin:    General: Skin is warm and dry.  Neurological:     Mental Status: He is alert and oriented to person, place, and time. Mental status is at baseline.  Psychiatric:        Mood and Affect: Mood normal.        Behavior: Behavior normal.     UC Treatments / Results  Labs (all labs ordered are listed, but only abnormal results are displayed) Labs Reviewed - No data to display  EKG   Radiology No results found.  Procedures Procedures (including critical care time)  Medications Ordered in UC Medications - No data to display  Initial Impression / Assessment and Plan / UC Course  I have reviewed the triage vital signs and the nursing notes.  Pertinent labs & imaging results that were available during my care of the patient were reviewed by me and considered in my medical decision making (see chart for details).  Bacterial upper respiratory infection  will try use of antibiotic due to timeline of illness, Z-Pak prescribed, vital signs are stable, patient has no signs of distress and lungs are clear, prescribed albuterol inhaler for management of shortness of breath and wheezing, patient over-the-counter medications for further symptom management and supportive care, work note given, urgent care follow-up as needed  Final Clinical Impressions(s) / UC Diagnoses   Final diagnoses:  None   Discharge Instructions   None    ED Prescriptions   None    PDMP not reviewed this encounter.   , Valinda Hoar 07/10/21 [phone]

## 2021-07-09 NOTE — Discharge Instructions (Signed)
Take azithromycin as directed  May use albuterol inhaler as needed for wheezing and shortness of breath     You can take Tylenol and/or Ibuprofen as needed for fever reduction and pain relief.   For cough: honey 1/2 to 1 teaspoon (you can dilute the honey in water or another fluid).  You can also use guaifenesin and dextromethorphan for cough. You can use a humidifier for chest congestion and cough.  If you don't have a humidifier, you can sit in the bathroom with the hot shower running.      For sore throat: try warm salt water gargles, cepacol lozenges, throat spray, warm tea or water with lemon/honey, popsicles or ice, or OTC cold relief medicine for throat discomfort.   For congestion: take a daily anti-histamine like Zyrtec, Claritin, and a oral decongestant, such as pseudoephedrine.  You can also use Flonase 1-2 sprays in each nostril daily.   It is important to stay hydrated: drink plenty of fluids (water, gatorade/powerade/pedialyte, juices, or teas) to keep your throat moisturized and help further relieve irritation/discomfort.
